# Patient Record
Sex: Male | Born: 1965 | Race: White | Hispanic: No | Marital: Married | Smoking: Former smoker
Health system: Southern US, Community
[De-identification: ages and names within clinical notes are randomized; demographics above are authoritative.]

## PROBLEM LIST (undated history)

## (undated) DIAGNOSIS — S2249XA Multiple fractures of ribs, unspecified side, initial encounter for closed fracture: Secondary | ICD-10-CM

## (undated) HISTORY — PX: NO PAST SURGERIES: SHX2092

---

## 2016-05-08 ENCOUNTER — Emergency Department (HOSPITAL_COMMUNITY): Payer: Worker's Compensation

## 2016-05-08 ENCOUNTER — Encounter (HOSPITAL_COMMUNITY): Payer: Self-pay | Admitting: Emergency Medicine

## 2016-05-08 ENCOUNTER — Inpatient Hospital Stay (HOSPITAL_COMMUNITY)
Admission: EM | Admit: 2016-05-08 | Discharge: 2016-05-11 | DRG: 185 | Disposition: A | Discharge status: Home / Self Care | Payer: Worker's Compensation | Attending: General Surgery | Admitting: General Surgery

## 2016-05-08 DIAGNOSIS — S298XXA Other specified injuries of thorax, initial encounter: Secondary | ICD-10-CM | POA: Diagnosis present

## 2016-05-08 DIAGNOSIS — W208XXA Other cause of strike by thrown, projected or falling object, initial encounter: Secondary | ICD-10-CM | POA: Diagnosis present

## 2016-05-08 DIAGNOSIS — S2242XA Multiple fractures of ribs, left side, initial encounter for closed fracture: Principal | ICD-10-CM | POA: Diagnosis present

## 2016-05-08 DIAGNOSIS — Z23 Encounter for immunization: Secondary | ICD-10-CM

## 2016-05-08 DIAGNOSIS — R079 Chest pain, unspecified: Secondary | ICD-10-CM | POA: Diagnosis not present

## 2016-05-08 DIAGNOSIS — R2681 Unsteadiness on feet: Secondary | ICD-10-CM

## 2016-05-08 DIAGNOSIS — F17290 Nicotine dependence, other tobacco product, uncomplicated: Secondary | ICD-10-CM | POA: Diagnosis present

## 2016-05-08 DIAGNOSIS — S2249XA Multiple fractures of ribs, unspecified side, initial encounter for closed fracture: Secondary | ICD-10-CM | POA: Diagnosis present

## 2016-05-08 HISTORY — DX: Multiple fractures of ribs, unspecified side, initial encounter for closed fracture: S22.49XA

## 2016-05-08 MED ORDER — SODIUM CHLORIDE 0.9 % IV BOLUS (SEPSIS)
1000.0000 mL | Freq: Once | INTRAVENOUS | Status: AC
  Administered 2016-05-09: 1000 mL via INTRAVENOUS

## 2016-05-08 MED ORDER — HYDROMORPHONE HCL 1 MG/ML IJ SOLN
1.0000 mg | Freq: Once | INTRAMUSCULAR | Status: AC
  Administered 2016-05-09: 1 mg via INTRAVENOUS
  Filled 2016-05-08: qty 1

## 2016-05-08 MED ORDER — TETANUS-DIPHTH-ACELL PERTUSSIS 5-2.5-18.5 LF-MCG/0.5 IM SUSP
0.5000 mL | Freq: Once | INTRAMUSCULAR | Status: AC
  Administered 2016-05-09: 0.5 mL via INTRAMUSCULAR
  Filled 2016-05-08: qty 0.5

## 2016-05-08 MED ORDER — FENTANYL CITRATE (PF) 100 MCG/2ML IJ SOLN: 50.0000 ug | INTRAMUSCULAR | Status: DC | PRN

## 2016-05-08 NOTE — ED Triage Notes (Signed)
Pt BIB EMS from work. Pt works for [employer] and was working underneath a truck when it began to [employer] off its jacks. Pt attempted to roll out from underneath but the moving vehicle caught his L side in passing. Pt c/o pain to L ribs, both to touch & movement. Denies pain/injury to head, neck, or spine. Denies SOB and no flail chest noted by EMS. Pt given 50mcg of Fentanyl by EMS.

## 2016-05-08 NOTE — ED Provider Notes (Signed)
[facility]-EMERGENCY DEPT Provider Note   CSN: [id number] Arrival date & time: 05/08/16  2254  By signing my name below, I, Austin Horne, attest that this documentation has been prepared under the direction and in the presence of [name], MD. Electronically Signed: Rosario Horne, ED Scribe. 05/08/16. 11:58 PM.  History   Chief Complaint Chief Complaint  Patient presents with  . Chest Pain   The history is provided by the patient, a friend and the EMS personnel. No language interpreter was used.   HPI Comments: Austin Horne is a 50 y.o. male BIB EMS, with no pertinent PMHx, who presents to the Emergency Department complaining of sudden onset, gradually worsening left-sided ribcage pain w/ associated wound to the area onset just PTA. He notes that his pain is now radiating into the upper left portion of his chest and he describes it as sharp. Pt reports that he was at work when a portion of truck that he was working underneath of fell and rolled onto the left portion of his chest, sustaining his current injuries. No other trauma or injuries. His pain to the area is exacerbated to palpation of the area and with generalized movements. EMS placed a cervical collar and administered of Fentanyl en route with minimal relief of his current pain. Denies neck pain, SOB, or any other associated symptoms. Tetanus is not UTD.   History reviewed. No pertinent past medical history.  There are no active problems to display for this patient.  History reviewed. No pertinent surgical history.  Home Medications    Prior to Admission medications   Medication Sig Start Date End Date Taking? Authorizing Provider  Omega-3 Fatty Acids (FISH OIL PO) Take 1 capsule by mouth daily.   Yes Historical Provider, MD   Family History No family history on file.  Social History Social History  Substance Use Topics  . Smoking status: Never Smoker  . Smokeless tobacco: Current User    Types:  Snuff  . Alcohol use 14.4 oz/week    24 Cans of beer per week   Allergies   Review of patient's allergies indicates no known allergies.  Review of Systems Review of Systems A complete 10 system review of systems was obtained and all systems are negative except as noted in the HPI and PMH.   Physical Exam Updated Vital Signs BP 136/92   Pulse 62   Temp 98.6 F (37 C) (Oral)   Resp 13   Ht 5\' 10"  (1.778 m)   Wt 220 lb (99.8 kg)   SpO2 98%   BMI 31.57 kg/m   Physical Exam  Constitutional: He is oriented to person, place, and time. Vital signs are normal. He appears well-developed and well-nourished.  Non-toxic appearance. He does not appear ill. No distress. Cervical collar in place.  HENT:  Head: Normocephalic and atraumatic.  Nose: Nose normal.  Mouth/Throat: Oropharynx is clear and moist. No oropharyngeal exudate.  Eyes: Conjunctivae and EOM are normal. Pupils are equal, round, and reactive to light. No scleral icterus.  Neck: Normal range of motion. Neck supple. No tracheal deviation, no edema, no erythema and normal range of motion present. No thyroid mass and no thyromegaly present.  Cardiovascular: Normal rate, regular rhythm, S1 normal, S2 normal, normal heart sounds, intact distal pulses and normal pulses.  Exam reveals no gallop and no friction rub.   No murmur heard. Pulmonary/Chest: Effort normal and breath sounds normal. No respiratory distress. He has no wheezes. He has no rhonchi.  He has no rales.  Abdominal: Soft. Normal appearance and bowel sounds are normal. He exhibits no distension, no ascites and no mass. There is no hepatosplenomegaly. There is no tenderness. There is no rebound, no guarding and no CVA tenderness.  Musculoskeletal: He exhibits tenderness. He exhibits no edema.  TTP over the left lateral ribs. 0.5cm abrasion over the same area.   Lymphadenopathy:    He has no cervical adenopathy.  Neurological: He is alert and oriented to person, place, and  time. He has normal strength. No cranial nerve deficit or sensory deficit.  Skin: Skin is warm, dry and intact. No petechiae and no rash noted. He is not diaphoretic. No erythema. No pallor.  Nursing note and vitals reviewed.  ED Treatments / Results  DIAGNOSTIC STUDIES: Oxygen Saturation is 97% on RA, normal by my interpretation.   COORDINATION OF CARE: 11:48 PM-Discussed next steps with pt. Pt verbalized understanding and is agreeable with the plan.   Labs (all labs ordered are listed, but only abnormal results are displayed) Labs Reviewed  CBC WITH DIFFERENTIAL/PLATELET - Abnormal; Notable for the following:       Result Value   WBC 14.7 (*)    Neutro Abs 12.2 (*)    All other components within normal limits  I-STAT CHEM 8, ED - Abnormal; Notable for the following:    Glucose, Bld 118 (*)    All other components within normal limits    EKG  EKG Interpretation None      Radiology Ct Chest W Contrast  Result Date: 05/09/2016 CLINICAL DATA:  U[employer] truck fell on left chest.  Left chest pain. EXAM: CT CHEST WITH CONTRAST TECHNIQUE: Multidetector CT imaging of the chest was performed during intravenous contrast administration. CONTRAST:  80mL ISOVUE-300 IOPAMIDOL (ISOVUE-300) INJECTION 61% COMPARISON:  Radiograph earlier this day. FINDINGS: Cardiovascular: No significant vascular findings. Normal heart size. No pericardial effusion. Mediastinum/Nodes: No enlarged mediastinal, hilar, or axillary lymph nodes. Thyroid gland, trachea, and esophagus demonstrate no significant findings. Lungs/Pleura: No pneumo or hemothorax. There is left pleural thickening adjacent to multiple rib fractures. No pulmonary contusion. Minimal atelectasis adjacent pleural thickening. Minimal dependent atelectasis also seen in the right lung. No pulmonary contusion. Upper Abdomen: No acute abnormality. Majority of the spleen is visualized and normal, no perisplenic fluid. Musculoskeletal: Note, there are 11  pairs of ribs in the included field of view. Multiple left-sided rib fractures. Fractures of the second through eighth ribs posteriorly are nondisplaced. Fracture of posterior left tenth rib is minimally displaced. No sternal fracture. No fracture or subluxation of the thoracic spine. Well corticated density posterior to spinous process of T1 appears chronic. IMPRESSION: Multiple posterior left rib fractures, nondisplaced involving ribs 2 through 8 and minimally displaced involving rib 10. Minimal adjacent pleural thickening but no hemothorax or pneumothorax. Electronically Signed   By: Rubye OaksMelanie  Ehinger M.D.   On: 05/09/2016 02:40   Dg Chest Port 1 View  Result Date: 05/09/2016 CLINICAL DATA:  50 y/o M; truck fell onto left-sided chest with chest pain and shortness of breath. EXAM: PORTABLE CHEST 1 VIEW COMPARISON:  None. FINDINGS: Normal cardiomediastinal silhouette. Low lung volumes accentuate pulmonary markings. Clear lungs. No pneumothorax or pleural effusion. Left posterior third and possibly fourth rib fractures identified. IMPRESSION: No acute cardiopulmonary process. No pneumothorax. Left posterior third and possibly fourth rib fractures, age indeterminate. Consider additional rib radiographs for better characterization. Electronically Signed   By: Mitzi HansenLance  Furusawa-Stratton M.D.   On: 05/09/2016 00:39    Procedures  Procedures   Medications Ordered in ED Medications  fentaNYL (SUBLIMAZE) injection 50 mcg (not administered)  HYDROmorphone (DILAUDID) injection 1 mg (1 mg Intravenous Given 05/09/16 0030)  sodium chloride 0.9 % bolus 1,000 mL (0 mLs Intravenous Stopped 05/09/16 0155)  Tdap (BOOSTRIX) injection 0.5 mL (0.5 mLs Intramuscular Given 05/09/16 0105)  iopamidol (ISOVUE-300) 61 % injection (80 mLs  Contrast Given 05/09/16 0038)  HYDROmorphone (DILAUDID) injection 1 mg (1 mg Intravenous Given 05/09/16 0155)  HYDROmorphone (DILAUDID) injection 0.5 mg (0.5 mg Intravenous Given 05/09/16  0258)   Initial Impression / Assessment and Plan / ED Course  I have reviewed the triage vital signs and the nursing notes.  Pertinent labs & imaging results that were available during my care of the patient were reviewed by me and considered in my medical decision making (see chart for details).  Clinical Course   Patient presents to the ED after a truck fell in his chest.  He has L rib pain, likely fractured based on physical exam.  Will obtain CT chest for further evaluation.  Tetanus updated.  Pain medication given.  3:11 AM CT reveals multiple rib fractures and his pain is difficult to control.  He warrants admission for obs.  I spoke with Dr. Sheliah Hatch who will admit for further care.  Final Clinical Impressions(s) / ED Diagnoses   Final diagnoses:  Closed fracture of multiple ribs of left side, initial encounter   New Prescriptions New Prescriptions   No medications on file   I personally performed the services described in this documentation, which was scribed in my presence. The recorded information has been reviewed and is accurate.      [name], MD 05/09/16 [phone]

## 2016-05-09 ENCOUNTER — Emergency Department (HOSPITAL_COMMUNITY): Payer: Worker's Compensation

## 2016-05-09 ENCOUNTER — Encounter (HOSPITAL_COMMUNITY): Payer: Self-pay | Admitting: Radiology

## 2016-05-09 DIAGNOSIS — S2249XA Multiple fractures of ribs, unspecified side, initial encounter for closed fracture: Secondary | ICD-10-CM | POA: Diagnosis present

## 2016-05-09 LAB — BASIC METABOLIC PANEL
Anion gap: 9 (ref 5–15)
BUN: 7 mg/dL (ref 6–20)
CHLORIDE: 105 mmol/L (ref 101–111)
CO2: 25 mmol/L (ref 22–32)
Calcium: 8.5 mg/dL — ABNORMAL LOW (ref 8.9–10.3)
Creatinine, Ser: 1 mg/dL (ref 0.61–1.24)
GFR calc non Af Amer: >60 mL/min (ref >60)
Glucose, Bld: 102 mg/dL — ABNORMAL HIGH (ref 65–99)
POTASSIUM: 3.3 mmol/L — AB (ref 3.5–5.1)
SODIUM: 139 mmol/L (ref 135–145)

## 2016-05-09 LAB — CBC WITH DIFFERENTIAL/PLATELET
BASOS ABS: 0 10*3/uL (ref 0.0–0.1)
BASOS PCT: 0 %
EOS ABS: 0.1 10*3/uL (ref 0.0–0.7)
Eosinophils Relative: 1 %
HEMATOCRIT: 44.3 % (ref 39.0–52.0)
HEMOGLOBIN: 15.3 g/dL (ref 13.0–17.0)
Lymphocytes Relative: 11 %
Lymphs Abs: 1.6 10*3/uL (ref 0.7–4.0)
MCH: 30.5 pg (ref 26.0–34.0)
MCHC: 34.5 g/dL (ref 30.0–36.0)
MCV: 88.4 fL (ref 78.0–100.0)
Monocytes Absolute: 0.8 10*3/uL (ref 0.1–1.0)
Monocytes Relative: 5 %
NEUTROS ABS: 12.2 10*3/uL — AB (ref 1.7–7.7)
NEUTROS PCT: 83 %
Platelets: 251 10*3/uL (ref 150–400)
RBC: 5.01 MIL/uL (ref 4.22–5.81)
RDW: 12.4 % (ref 11.5–15.5)
WBC: 14.7 10*3/uL — AB (ref 4.0–10.5)

## 2016-05-09 LAB — CBC
HEMATOCRIT: 43.2 % (ref 39.0–52.0)
HEMOGLOBIN: 14.7 g/dL (ref 13.0–17.0)
MCH: 30.2 pg (ref 26.0–34.0)
MCHC: 34 g/dL (ref 30.0–36.0)
MCV: 88.7 fL (ref 78.0–100.0)
Platelets: 237 10*3/uL (ref 150–400)
RBC: 4.87 MIL/uL (ref 4.22–5.81)
RDW: 12.5 % (ref 11.5–15.5)
WBC: 10.8 10*3/uL — ABNORMAL HIGH (ref 4.0–10.5)

## 2016-05-09 LAB — I-STAT CHEM 8, ED
BUN: 10 mg/dL (ref 6–20)
CREATININE: 0.9 mg/dL (ref 0.61–1.24)
Calcium, Ion: 1.16 mmol/L (ref 1.15–1.40)
Chloride: 102 mmol/L (ref 101–111)
Glucose, Bld: 118 mg/dL — ABNORMAL HIGH (ref 65–99)
HEMATOCRIT: 44 % (ref 39.0–52.0)
Hemoglobin: 15 g/dL (ref 13.0–17.0)
POTASSIUM: 3.7 mmol/L (ref 3.5–5.1)
SODIUM: 141 mmol/L (ref 135–145)
TCO2: 24 mmol/L (ref 0–100)

## 2016-05-09 MED ORDER — OXYCODONE HCL 5 MG PO TABS
5.0000 mg | ORAL_TABLET | ORAL | Status: DC | PRN
  Administered 2016-05-09 – 2016-05-10 (×4): 10 mg via ORAL
  Filled 2016-05-09 (×4): qty 2

## 2016-05-09 MED ORDER — IBUPROFEN 600 MG PO TABS
600.0000 mg | ORAL_TABLET | Freq: Four times a day (QID) | ORAL | Status: DC | PRN
Start: 2016-05-09 — End: 2016-05-10
  Administered 2016-05-09: 600 mg via ORAL
  Filled 2016-05-09: qty 1

## 2016-05-09 MED ORDER — METHOCARBAMOL 500 MG PO TABS
500.0000 mg | ORAL_TABLET | Freq: Three times a day (TID) | ORAL | Status: DC | PRN
  Administered 2016-05-09 – 2016-05-10 (×3): 500 mg via ORAL
  Filled 2016-05-09 (×3): qty 1

## 2016-05-09 MED ORDER — HYDROMORPHONE HCL 1 MG/ML IJ SOLN
0.5000 mg | Freq: Once | INTRAMUSCULAR | Status: AC
  Administered 2016-05-09: 0.5 mg via INTRAVENOUS
  Filled 2016-05-09: qty 1

## 2016-05-09 MED ORDER — OXYCODONE HCL 5 MG PO TABS: 5.0000 mg | ORAL_TABLET | ORAL | Status: DC | PRN

## 2016-05-09 MED ORDER — ONDANSETRON HCL 4 MG PO TABS: 4.0000 mg | ORAL_TABLET | Freq: Four times a day (QID) | ORAL | Status: DC | PRN

## 2016-05-09 MED ORDER — ACETAMINOPHEN 325 MG PO TABS: 650.0000 mg | ORAL_TABLET | ORAL | Status: DC | PRN

## 2016-05-09 MED ORDER — MORPHINE SULFATE (PF) 2 MG/ML IV SOLN
2.0000 mg | INTRAVENOUS | Status: DC | PRN
  Administered 2016-05-09 (×2): 2 mg via INTRAVENOUS
  Administered 2016-05-10: 4 mg via INTRAVENOUS
  Administered 2016-05-10 (×2): 2 mg via INTRAVENOUS
  Filled 2016-05-09 (×3): qty 1
  Filled 2016-05-09: qty 2
  Filled 2016-05-09: qty 1

## 2016-05-09 MED ORDER — KETOROLAC TROMETHAMINE 30 MG/ML IJ SOLN
30.0000 mg | Freq: Once | INTRAMUSCULAR | Status: AC
  Administered 2016-05-09: 30 mg via INTRAVENOUS
  Filled 2016-05-09: qty 1

## 2016-05-09 MED ORDER — IOPAMIDOL (ISOVUE-300) INJECTION 61%
INTRAVENOUS | Status: AC
  Administered 2016-05-09: 80 mL
  Filled 2016-05-09: qty 100

## 2016-05-09 MED ORDER — HYDROMORPHONE HCL 1 MG/ML IJ SOLN
1.0000 mg | Freq: Once | INTRAMUSCULAR | Status: AC
  Administered 2016-05-09: 1 mg via INTRAVENOUS
  Filled 2016-05-09: qty 1

## 2016-05-09 MED ORDER — ONDANSETRON HCL 4 MG/2ML IJ SOLN: 4.0000 mg | Freq: Four times a day (QID) | INTRAMUSCULAR | Status: DC | PRN

## 2016-05-09 NOTE — ED Notes (Signed)
Patient transported to CT 

## 2016-05-09 NOTE — Progress Notes (Signed)
OT Cancellation Note  Patient Details Name: Austin SlatesKevin Horne MRN: [id number] DOB: 09-Aug-1965   Cancelled Treatment:    Reason Eval/Treat Not Completed: Other (comment). Passed PT in hallway who had just finished evaluating pt and reported that he was trying to clear his throat while they were ambulating and this set off severe pain on left side, they had to push him back to bed in chair and RN gave pt pain meds. No a good time currently for me to try and evaluate him. Will re-attempt eval at a later time.  Evette GeorgesLeonard, Clarke Amburn Eva [phone] 05/09/2016, 12:14 PM

## 2016-05-09 NOTE — Progress Notes (Signed)
Patient just to room this morning.  Will order PT/OT.  Try to get the patient home by tomorrow.  Marta LamasJames O. Gae BonWyatt, III, MD, FACS [phone] Trauma Surgeon

## 2016-05-09 NOTE — ED Notes (Signed)
Patient transported to X-ray 

## 2016-05-09 NOTE — Care Management Note (Signed)
Case Management Note  Patient Details  Name: Austin SlatesKevin Horne MRN: [id number] Date of Birth: May 03, 1966  Subjective/Objective:  Pt admitted on 05/08/16 after a [employer] truck he was working on partially landed on his Lt chest.  He sustained multiple rib fractures.   PTA, pt independent, lives with spouse, [name].                   Action/Plan: Will follow for discharge planning as pt progresses.  PT recommending no OP follow up.    Expected Discharge Date:                  Expected Discharge Plan:  Home/Self Care  In-House Referral:     Discharge planning Services  CM Consult  Post Acute Care Choice:    Choice offered to:     DME Arranged:    DME Agency:     HH Arranged:    HH Agency:     Status of Service:  In process, will continue to follow  If discussed at Long Length of Stay Meetings, dates discussed:    Additional Comments:  Quintella BatonJulie W. Amahri Dengel, RN, BSN  Trauma/Neuro ICU Case Manager [phone]

## 2016-05-09 NOTE — Evaluation (Signed)
Physical Therapy Evaluation Patient Details Name: Austin Horne MRN: [id number] DOB: 24-Jul-1966 Today's Date: 05/09/2016   History of Present Illness  50 yo male working under [employer] truck when jack slipped. The truck partially landed on his left chest while rolling. He was able to slide himself from under the truck in a matter of seconds. No loss of consciousness or head trauma. Xray revealed L rib fxs 2-8 and 10. No fx noted L knee.  Clinical Impression  On eval, pt required min assist for bed mobility and transfers. Min guard assist provided for ambulation 150 feet. When pt began experiencing muscle spasms L flank, he needed min assist to maintain stance due to 10/10 pain. Once pain is managed, pt will be able to manage mobility at home with supervision from spouse. No DME recommended from PT or follow up services. PT will continue to follow acutely.    Follow Up Recommendations No PT follow up;Supervision for mobility/OOB    Equipment Recommendations  None recommended by PT    Recommendations for Other Services       Precautions / Restrictions Precautions Precautions: Fall Restrictions Weight Bearing Restrictions: No      Mobility  Bed Mobility Overal bed mobility: Needs Assistance Bed Mobility: Supine to Sit;Sit to Supine     Supine to sit: Min assist Sit to supine: Min assist   General bed mobility comments: assist to elevate trunk during sup to sit. Assist with BLE during sit to sup.  Transfers Overall transfer level: Needs assistance Equipment used: None Transfers: Sit to/from [employer] CorporationStand;Stand Pivot Transfers Sit to Stand: Min assist Stand pivot transfers: Min assist       General transfer comment: increased time to attain full upright stance and stabilize initial balance  Ambulation/Gait Ambulation/Gait assistance: Min guard Ambulation Distance (Feet): 150 Feet Assistive device: None Gait Pattern/deviations: Step-through pattern;Decreased stride length Gait  velocity: decreased Gait velocity interpretation: Below normal speed for age/gender General Gait Details: Pt with c/o mild stiffness L knee during ambulation but no pain. After completing 150 feet ambulation, pt reported feeling dry mouth with initiated the need to cough. This brought on ms spasms in rib cage resulting in 10/10 pain. Pt's knees buckling from the pain requiring min assist to maintain stance. NT retrieved chair and pt rolled back to room. After seated rest, pt was able to SPT back to bed with min assist.  Stairs            Wheelchair Mobility    Modified Rankin (Stroke Patients Only)       Balance Overall balance assessment: No apparent balance deficits (not formally assessed)                                           Pertinent Vitals/Pain Pain Assessment: 0-10 Pain Score: 10-Worst pain ever Pain Location: L flank during ms spasms Pain Descriptors / Indicators: Spasm Pain Intervention(s): Repositioned;Patient requesting pain meds-RN notified;Monitored during session;Limited activity within patient's tolerance    Home Living Family/patient expects to be discharged to:: Private residence Living Arrangements: Spouse/significant other Available Help at Discharge: Family;Available 24 hours/day Type of Home: Mobile home Home Access: Stairs to enter Entrance Stairs-Rails: Right;Left;Can reach both Entrance Stairs-Number of Steps: 5 Home Layout: One level Home Equipment: None      Prior Function Level of Independence: Independent         Comments: Pt injured at  work.     Higher education careers adviser        Extremity/Trunk Assessment                         Communication   Communication: No difficulties  Cognition Arousal/Alertness: Awake/alert Behavior During Therapy: WFL for tasks assessed/performed Overall Cognitive Status: Within Functional Limits for tasks assessed                      General Comments       Exercises     Assessment/Plan    PT Assessment Patient needs continued PT services  PT Problem List Decreased activity tolerance;Decreased mobility;Pain;Decreased knowledge of precautions          PT Treatment Interventions DME instruction;Gait training;Stair training;Functional mobility training;Balance training;Therapeutic exercise;Therapeutic activities;Patient/family education    PT Goals (Current goals can be found in the Care Plan section)  Acute Rehab PT Goals Patient Stated Goal: decrease pain PT Goal Formulation: With patient Time For Goal Achievement: 05/16/16 Potential to Achieve Goals: Good    Frequency Min 6X/week   Barriers to discharge        Co-evaluation               End of Session Equipment Utilized During Treatment: Gait belt Activity Tolerance: Patient limited by pain Patient left: in bed;with call bell/phone within reach Nurse Communication: Mobility status;Patient requests pain meds    Functional Assessment Tool Used: clinical judgement Functional Limitation: Mobility: Walking and moving around Mobility: Walking and Moving Around Current Status [phone]): At least 1 percent but less than 20 percent impaired, limited or restricted Mobility: Walking and Moving Around Goal Status [phone]): At least 1 percent but less than 20 percent impaired, limited or restricted    Time: 1143-1210 PT Time Calculation (min) (ACUTE ONLY): 27 min   Charges:   PT Evaluation $PT Eval Moderate Complexity: 1 Procedure PT Treatments $Gait Training: 8-22 mins   PT G Codes:   PT G-Codes **NOT FOR INPATIENT CLASS** Functional Assessment Tool Used: clinical judgement Functional Limitation: Mobility: Walking and moving around Mobility: Walking and Moving Around Current Status (U9811): At least 1 percent but less than 20 percent impaired, limited or restricted Mobility: Walking and Moving Around Goal Status [phone]): At least 1 percent but less than 20 percent  impaired, limited or restricted    Ilda Foil 05/09/2016, 12:28 PM

## 2016-05-09 NOTE — H&P (Signed)
**Note Austin-Identified via Obfuscation** History   Austin SlatesKevin Horne is an 50 y.o. male.   Chief Complaint:  Chief Complaint  Patient presents with  . Chest Pain    50 yo male working under [employer] truck when jack slipped. The truck partially landed on his left chest while rolling. He was able to slide himself from under the truck in a matter of seconds. No loss of consciousness or head trauma. He complains of left chest pain currently and some tightness in his left knee.   Chest Pain  Associated symptoms: no abdominal pain, no cough, no dizziness, no fever, no headache, no nausea, no palpitations and no vomiting     History reviewed. No pertinent past medical history.  History reviewed. No pertinent surgical history.  No family history on file. Social History:  reports that he has never smoked. His smokeless tobacco use includes Snuff. He reports that he drinks about 14.4 oz of alcohol per week . He reports that he does not use drugs.  Allergies  No Known Allergies  Home Medications   (Not in a hospital admission)  Trauma Course   Results for orders placed or performed during the hospital encounter of 05/08/16 (from the past 48 hour(s))  CBC with Differential/Platelet     Status: Abnormal   Collection Time: 05/09/16  1:05 AM  Result Value Ref Range   WBC 14.7 (H) 4.0 - 10.5 K/uL   RBC 5.01 4.22 - 5.81 MIL/uL   Hemoglobin 15.3 13.0 - 17.0 g/dL   HCT 11.944.3 14.739.0 - 82.952.0 %   MCV 88.4 78.0 - 100.0 fL   MCH 30.5 26.0 - 34.0 pg   MCHC 34.5 30.0 - 36.0 g/dL   RDW 56.212.4 13.011.5 - 86.515.5 %   Platelets 251 150 - 400 K/uL   Neutrophils Relative % 83 %   Neutro Abs 12.2 (H) 1.7 - 7.7 K/uL   Lymphocytes Relative 11 %   Lymphs Abs 1.6 0.7 - 4.0 K/uL   Monocytes Relative 5 %   Monocytes Absolute 0.8 0.1 - 1.0 K/uL   Eosinophils Relative 1 %   Eosinophils Absolute 0.1 0.0 - 0.7 K/uL   Basophils Relative 0 %   Basophils Absolute 0.0 0.0 - 0.1 K/uL  I-stat chem 8, ed     Status: Abnormal   Collection Time: 05/09/16  1:11 AM  Result  Value Ref Range   Sodium 141 135 - 145 mmol/L   Potassium 3.7 3.5 - 5.1 mmol/L   Chloride 102 101 - 111 mmol/L   BUN 10 6 - 20 mg/dL   Creatinine, Ser 7.840.90 0.61 - 1.24 mg/dL   Glucose, Bld 696118 (H) 65 - 99 mg/dL   Calcium, Ion 2.951.16 2.841.15 - 1.40 mmol/L   TCO2 24 0 - 100 mmol/L   Hemoglobin 15.0 13.0 - 17.0 g/dL   HCT 13.244.0 44.039.0 - 10.252.0 %   Ct Chest W Contrast  Result Date: 05/09/2016 CLINICAL DATA:  UPS truck fell on left chest.  Left chest pain. EXAM: CT CHEST WITH CONTRAST TECHNIQUE: Multidetector CT imaging of the chest was performed during intravenous contrast administration. CONTRAST:  80mL ISOVUE-300 IOPAMIDOL (ISOVUE-300) INJECTION 61% COMPARISON:  Radiograph earlier this day. FINDINGS: Cardiovascular: No significant vascular findings. Normal heart size. No pericardial effusion. Mediastinum/Nodes: No enlarged mediastinal, hilar, or axillary lymph nodes. Thyroid gland, trachea, and esophagus demonstrate no significant findings. Lungs/Pleura: No pneumo or hemothorax. There is left pleural thickening adjacent to multiple rib fractures. No pulmonary contusion. Minimal atelectasis adjacent pleural thickening. Minimal dependent atelectasis  also seen in the right lung. No pulmonary contusion. Upper Abdomen: No acute abnormality. Majority of the spleen is visualized and normal, no perisplenic fluid. Musculoskeletal: Note, there are 11 pairs of ribs in the included field of view. Multiple left-sided rib fractures. Fractures of the second through eighth ribs posteriorly are nondisplaced. Fracture of posterior left tenth rib is minimally displaced. No sternal fracture. No fracture or subluxation of the thoracic spine. Well corticated density posterior to spinous process of T1 appears chronic. IMPRESSION: Multiple posterior left rib fractures, nondisplaced involving ribs 2 through 8 and minimally displaced involving rib 10. Minimal adjacent pleural thickening but no hemothorax or pneumothorax. Electronically  Signed   By: Rubye Oaks M.D.   On: 05/09/2016 02:40   Dg Chest Port 1 View  Result Date: 05/09/2016 CLINICAL DATA:  50 y/o M; truck fell onto left-sided chest with chest pain and shortness of breath. EXAM: PORTABLE CHEST 1 VIEW COMPARISON:  None. FINDINGS: Normal cardiomediastinal silhouette. Low lung volumes accentuate pulmonary markings. Clear lungs. No pneumothorax or pleural effusion. Left posterior third and possibly fourth rib fractures identified. IMPRESSION: No acute cardiopulmonary process. No pneumothorax. Left posterior third and possibly fourth rib fractures, age indeterminate. Consider additional rib radiographs for better characterization. Electronically Signed   By: Mitzi Hansen M.D.   On: 05/09/2016 00:39   Dg Knee Complete 4 Views Left  Result Date: 05/09/2016 CLINICAL DATA:  Left knee pain.  Car fell on top of patient. EXAM: LEFT KNEE - COMPLETE 4+ VIEW COMPARISON:  None. FINDINGS: No acute fracture or subluxation. Mild tricompartmental osteoarthritis with peripheral spurring. No joint effusion. Anterior soft tissue edema. IMPRESSION: 1. Anterior soft tissue edema. No fracture or dislocation. No joint effusion. 2. Mild tricompartmental osteoarthritis. Electronically Signed   By: Rubye Oaks M.D.   On: 05/09/2016 03:55    Review of Systems  Constitutional: Negative for chills and fever.  HENT: Negative for hearing loss.   Eyes: Negative for blurred vision and double vision.  Respiratory: Negative for cough and hemoptysis.   Cardiovascular: Positive for chest pain. Negative for palpitations.  Gastrointestinal: Negative for abdominal pain, nausea and vomiting.  Genitourinary: Negative for dysuria and urgency.  Musculoskeletal: Negative for myalgias and neck pain.  Skin: Negative for itching and rash.  Neurological: Negative for dizziness, tingling and headaches.  Endo/Heme/Allergies: Does not bruise/bleed easily.  Psychiatric/Behavioral: Negative for  depression and suicidal ideas.    Blood pressure 114/67, pulse 64, temperature 98.6 F (37 C), temperature source Oral, resp. rate 13, height 5\' 10"  (1.778 m), weight 99.8 kg (220 lb), SpO2 97 %. Physical Exam  Nursing note and vitals reviewed. Constitutional: He is oriented to person, place, and time. He appears well-developed and well-nourished.  HENT:  Head: Normocephalic and atraumatic.  Eyes: Conjunctivae and EOM are normal. No scleral icterus.  Neck: Normal range of motion. Neck supple.  Cardiovascular: Normal rate and regular rhythm.   Respiratory: Effort normal and breath sounds normal. He has no wheezes. He has no rales. He exhibits tenderness.  Small 3cm abrasion left posterior chest  GI: Soft. He exhibits no distension. There is no tenderness. There is no rebound.  Musculoskeletal: Normal range of motion. He exhibits no edema.  Neurological: He is alert and oriented to person, place, and time.  Skin: Skin is warm and dry.  Psychiatric: He has a normal mood and affect. His behavior is normal.     Assessment/Plan 50 yo male with left ribs 2-8 and 10 after traumatic incident while  working under a [employer] truck. No signs of head injury, neck cleared clinically. He has required a moderate amount of pain medication in the ER and has a leukocytosis. We discussed issues with rib fractures and goals to get his pain under control and attempt to prevent any lung infection or secondary issues. XR of left knee reviewed, no fracture, will ambulate patient in am. -observation for pain control after multiple rib fractures -IS, pulm toilet -ambulate -full liquids and advance  Austin Horne 05/09/2016, 4:08 AM   Procedures

## 2016-05-10 DIAGNOSIS — F17290 Nicotine dependence, other tobacco product, uncomplicated: Secondary | ICD-10-CM | POA: Diagnosis present

## 2016-05-10 DIAGNOSIS — S2242XA Multiple fractures of ribs, left side, initial encounter for closed fracture: Secondary | ICD-10-CM | POA: Diagnosis present

## 2016-05-10 DIAGNOSIS — R079 Chest pain, unspecified: Secondary | ICD-10-CM | POA: Diagnosis present

## 2016-05-10 DIAGNOSIS — S298XXA Other specified injuries of thorax, initial encounter: Secondary | ICD-10-CM | POA: Diagnosis present

## 2016-05-10 DIAGNOSIS — Z23 Encounter for immunization: Secondary | ICD-10-CM | POA: Diagnosis not present

## 2016-05-10 DIAGNOSIS — W208XXA Other cause of strike by thrown, projected or falling object, initial encounter: Secondary | ICD-10-CM | POA: Diagnosis present

## 2016-05-10 MED ORDER — DOCUSATE SODIUM 100 MG PO CAPS
100.0000 mg | ORAL_CAPSULE | Freq: Two times a day (BID) | ORAL | Status: DC
  Administered 2016-05-10 – 2016-05-11 (×3): 100 mg via ORAL
  Filled 2016-05-10 (×3): qty 1

## 2016-05-10 MED ORDER — POLYETHYLENE GLYCOL 3350 17 G PO PACK
17.0000 g | PACK | Freq: Every day | ORAL | Status: DC
  Administered 2016-05-10 – 2016-05-11 (×2): 17 g via ORAL
  Filled 2016-05-10 (×2): qty 1

## 2016-05-10 MED ORDER — TRAMADOL HCL 50 MG PO TABS
100.0000 mg | ORAL_TABLET | Freq: Four times a day (QID) | ORAL | Status: DC
  Administered 2016-05-10 – 2016-05-11 (×4): 100 mg via ORAL
  Filled 2016-05-10 (×5): qty 2

## 2016-05-10 MED ORDER — MORPHINE SULFATE (PF) 2 MG/ML IV SOLN
2.0000 mg | INTRAVENOUS | Status: DC | PRN
  Administered 2016-05-10 (×2): 2 mg via INTRAVENOUS
  Filled 2016-05-10 (×2): qty 1

## 2016-05-10 MED ORDER — OXYCODONE HCL 5 MG PO TABS
10.0000 mg | ORAL_TABLET | ORAL | Status: DC | PRN
  Administered 2016-05-10 – 2016-05-11 (×5): 20 mg via ORAL
  Filled 2016-05-10 (×5): qty 4

## 2016-05-10 MED ORDER — NAPROXEN 250 MG PO TABS
500.0000 mg | ORAL_TABLET | Freq: Two times a day (BID) | ORAL | Status: DC
  Administered 2016-05-10 – 2016-05-11 (×2): 500 mg via ORAL
  Filled 2016-05-10 (×2): qty 2

## 2016-05-10 MED ORDER — ZOLPIDEM TARTRATE 5 MG PO TABS
10.0000 mg | ORAL_TABLET | Freq: Every evening | ORAL | Status: DC | PRN
  Administered 2016-05-10: 10 mg via ORAL
  Filled 2016-05-10: qty 2

## 2016-05-10 MED ORDER — ENOXAPARIN SODIUM 30 MG/0.3ML ~~LOC~~ SOLN
30.0000 mg | Freq: Two times a day (BID) | SUBCUTANEOUS | Status: DC
  Administered 2016-05-10 – 2016-05-11 (×3): 30 mg via SUBCUTANEOUS
  Filled 2016-05-10 (×3): qty 0.3

## 2016-05-10 MED ORDER — METHOCARBAMOL 750 MG PO TABS
1500.0000 mg | ORAL_TABLET | Freq: Four times a day (QID) | ORAL | Status: DC
Start: 2016-05-10 — End: 2016-05-11
  Administered 2016-05-10 – 2016-05-11 (×6): 1500 mg via ORAL
  Filled 2016-05-10 (×6): qty 2

## 2016-05-10 NOTE — Progress Notes (Signed)
Patient ID: Encarnacion SlatesKevin Mumaw, male   DOB: [birth year], 50 y.o.   MRN: [id number]   LOS: 0 days   Subjective: Horrible pain, can't move, seeming muscle spasms whenever he falls asleep.   Objective: Vital signs in last 24 hours: Temp:  [98 F (36.7 C)-98.8 F (37.1 C)] 98.2 F (36.8 C) (10/11 0620) Pulse Rate:  [57-63] 57 (10/11 0620) Resp:  [16] 16 (10/11 0620) BP: (123-143)/(61-80) 123/75 (10/11 0620) SpO2:  [96 %-98 %] 98 % (10/11 0620) Last BM Date: 05/08/16   IS: 500ml   Physical Exam General appearance: alert and no distress Resp: clear to auscultation bilaterally Cardio: regular rate and rhythm GI: normal findings: bowel sounds normal and soft, non-tender   Assessment/Plan: Blunt thoracic trauma Multiple left rib fxs -- Pulmonary toilet FEN -- Add NSAID and tramadol, increase and schedule muscle relaxer, increase OxyIR range VTE -- SCD's, Lovenox Dispo -- Need better pain control    Freeman CaldronMichael J. Tiajah Oyster, PA-C Pager: [phone] General Trauma PA Pager: [phone]  05/10/2016

## 2016-05-10 NOTE — Progress Notes (Signed)
Pt. Stated he felt SOB after getting up to the chair. O2 sats were 94%. Pt. Requested oxygen. Placed him on 1Liter of O2.

## 2016-05-10 NOTE — Progress Notes (Signed)
Physical Therapy Treatment Patient Details Name: Austin SlatesKevin Horne MRN: [id number] DOB: [birth year] Today's Date: 05/10/2016    History of Present Illness 50 yo male working under [employer] truck when jack slipped. The truck partially landed on his left chest while rolling. He was able to slide himself from under the truck in a matter of seconds. No loss of consciousness or head trauma. Xray revealed L rib fxs 2-8 and 10. No fx noted L knee.    PT Comments    Pt presents with significant decline in function secondary to increase in pain in L flank/rib cage.  Pt will require rehab in a post acute setting before returning home.  Pt is currently providing +2 assistance for safe mobility.  Informed supervising PT of patient recommendations.     Follow Up Recommendations  CIR (CIR )     Equipment Recommendations  None recommended by PT (TBD at next venue.  )    Recommendations for Other Services Rehab consult     Precautions / Restrictions Precautions Precautions: Fall Precaution Comments: premedicate due to spasms Restrictions Weight Bearing Restrictions: No    Mobility  Bed Mobility Overal bed mobility: Needs Assistance Bed Mobility: Rolling Rolling: Max assist     Sit to supine: Mod assist;+2 for physical assistance   General bed mobility comments: Pt required assist for back to bed transfer.  pillow placed on L flank to splint rib fractures.    Transfers Overall transfer level: Needs assistance Equipment used: None Transfers: Sit to/from Stand Sit to Stand: Mod assist;+2 physical assistance (from chair to stand in stedy frame.  From stedy seat plates require min assist +1.  )         General transfer comment: Pt stood from recliner to stedy frame and from stedy seat plates.  +2 assist provided from lower seat height.  Pt groaning in pain during session.  Pt required cues for hand placement and assistance to boost into standing.    Ambulation/Gait Ambulation/Gait assistance:   (unable, spasms severly limiting function at this point in recovery.  )               Stairs            Wheelchair Mobility    Modified Rankin (Stroke Patients Only)       Balance Overall balance assessment: Needs assistance   Sitting balance-Leahy Scale: Poor       Standing balance-Leahy Scale: Poor                      Cognition Arousal/Alertness: Awake/alert Behavior During Therapy: WFL for tasks assessed/performed Overall Cognitive Status: Within Functional Limits for tasks assessed                      Exercises      General Comments        Pertinent Vitals/Pain Pain Assessment: 0-10 Pain Score: 10-Worst pain ever Pain Location: L flank with severe spasms Pain Descriptors / Indicators: Spasm Pain Intervention(s): Monitored during session;Repositioned    Home Living Family/patient expects to be discharged to:: Private residence Living Arrangements: Spouse/significant other;Children Available Help at Discharge: Family;Available 24 hours/day Type of Home: Mobile home Home Access: Stairs to enter Entrance Stairs-Rails: Right;Left;Can reach both Home Layout: One level Home Equipment: None      Prior Function Level of Independence: Independent      Comments: Pt injured at work.   PT Goals (current goals can now be found in  the care plan section) Acute Rehab PT Goals Patient Stated Goal: decrease pain Potential to Achieve Goals: Good Progress towards PT goals: Progressing toward goals    Frequency    Min 6X/week      PT Plan Discharge plan needs to be updated    Co-evaluation             End of Session Equipment Utilized During Treatment:  (no gait belt used due to rib fractures.  ) Activity Tolerance: Patient limited by pain (and muscle spasms in L flank) Patient left: in bed;with call bell/phone within reach     Time: 1610-9604 PT Time Calculation (min) (ACUTE ONLY): 26 min  Charges:  $Therapeutic  Activity: 23-37 mins                    G Codes:      Florestine Avers 26-May-2016, 6:23 PM  Joycelyn Rua, PTA pager [phone]

## 2016-05-10 NOTE — Evaluation (Signed)
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 The truck partially landed on his left chest while rolling. He was able to slide himself from under the truck in a matter of seconds. No loss of consciousness or head trauma. Xray revealed L rib fxs 2-8 and 10. No fx noted L knee.   Clinical Impression   PT admitted with L rib fx 2-8 and 10. Pt currently with functional limitiations due to the deficits listed below (see OT problem list). pTA was independent with all adls. Pt very limited this session by muscle spasms. WIFE VERY Concern with d/c home due to pain management.  Pt will benefit from skilled OT to increase their independence and safety with adls and balance to allow discharge HHOT. Pt unable to progress to EOB this session. Rn arriving to give medications.      Follow Up Recommendations  Home health OT    Equipment Recommendations  3 in 1 bedside comode;Other (comment) (RW)    Recommendations for Other Services       Precautions / Restrictions Precautions Precautions: Fall Precaution Comments: premedicate due to spasms      Mobility Bed Mobility Overal bed mobility: Needs Assistance Bed Mobility: Rolling Rolling: Max assist         General bed mobility comments: pt unable to tolerate bed mobility. pt provided pillow to help with rib fx. Pt unable to achieve EOB  Transfers                 General transfer comment: unable to achieve    Balance                                            ADL Overall ADL's : Needs assistance/impaired   Eating/Feeding Details (indicate cue type and reason): unable to eat lunch due to pain and inability to get oOb today per patient Grooming: Wash/dry face;Wash/dry hands;Oral care;Min guard;Bed level   Upper Body Bathing: Maximal assistance   Lower Body Bathing: Maximal assistance                         General ADL Comments: Wife present and agreeable to (A) for transfer to learn how to transfer patient. wife concerned with home level. pt with severe spasms and unable to reach EOB sitting this session. RN called and arriving with morphine. OT speaking with RN regarding pending d/c with severe pain and inability to manage pain on IV morphine at home. RN reports addressing with MD      Vision     Perception     Praxis      Pertinent Vitals/Pain Pain Assessment: 0-10 Pain Score: 10-Worst pain ever Pain Location: L flank with severe spasms Pain Descriptors / Indicators: Spasm Pain Intervention(s): Monitored during session;Limited activity within patient's tolerance;Repositioned;RN gave pain meds during session;Patient requesting pain meds-RN notified     Hand Dominance Right   Extremity/Trunk Assessment Upper Extremity Assessment Upper Extremity Assessment: Generalized weakness (difficulty with shoulder elevation at 90 due to pain)   Lower Extremity Assessment Lower Extremity Assessment: Defer to PT evaluation   Cervical / Trunk Assessment Cervical / Trunk Assessment: Normal   Communication Communication Communication: No difficulties   Cognition Arousal/Alertness:  Awake/alert Behavior During Therapy: WFL for tasks assessed/performed Overall Cognitive Status: Within Functional Limits for tasks assessed                     General Comments       Exercises       Shoulder Instructions      Home Living Family/patient expects to be discharged to:: Private residence Living Arrangements: Spouse/significant other;Children Available Help at Discharge: Family;Available 24 hours/day Type of Home: Mobile home Home Access: Stairs to enter Entrance Stairs-Number of Steps: 5 Entrance Stairs-Rails: Right;Left;Can reach both Home Layout: One level         Bathroom Toilet: Standard     Home  Equipment: None          Prior Functioning/Environment Level of Independence: Independent        Comments: Pt injured at work.        OT Problem List: Decreased strength;Decreased activity tolerance;Impaired balance (sitting and/or standing);Decreased safety awareness;Decreased knowledge of use of DME or AE;Decreased knowledge of precautions;Pain   OT Treatment/Interventions: Self-care/ADL training;Neuromuscular education;Therapeutic exercise;DME and/or AE instruction;Therapeutic activities;Patient/family education;Balance training    OT Goals(Current goals can be found in the care plan section) Acute Rehab OT Goals Patient Stated Goal: decrease pain OT Goal Formulation: With patient/family Time For Goal Achievement: 05/24/16 Potential to Achieve Goals: Good  OT Frequency: Min 3X/week   Barriers to D/C:            Co-evaluation              End of Session Nurse Communication: Mobility status;Precautions  Activity Tolerance: Patient limited by pain Patient left: in bed;with call bell/phone within reach;with bed alarm set;with family/visitor present;with SCD's reapplied   Time: 1425-1500 OT Time Calculation (min): 35 min Charges:  OT General Charges $OT Visit: 1 Procedure OT Evaluation $OT Eval Moderate Complexity: 1 Procedure OT Treatments $Self Care/Home Management : 8-22 mins G-Codes:    Boone MasterJones, Astha Probasco B 05/10/2016, 3:07 PM   Mateo FlowJones, Brynn   OTR/L Pager: [phone]: [phone] Office: [phone] .

## 2016-05-11 MED ORDER — NAPROXEN 500 MG PO TABS: 500.0000 mg | ORAL_TABLET | Freq: Two times a day (BID) | ORAL | 1 refills | Status: AC

## 2016-05-11 MED ORDER — TRAMADOL HCL 50 MG PO TABS: 100.0000 mg | ORAL_TABLET | Freq: Four times a day (QID) | ORAL | 0 refills | Status: AC

## 2016-05-11 MED ORDER — OXYCODONE-ACETAMINOPHEN 10-325 MG PO TABS: 1.0000 | ORAL_TABLET | ORAL | 0 refills | Status: AC | PRN

## 2016-05-11 MED ORDER — METHOCARBAMOL 500 MG PO TABS: 1000.0000 mg | ORAL_TABLET | Freq: Four times a day (QID) | ORAL | 0 refills | Status: AC | PRN

## 2016-05-11 NOTE — Discharge Summary (Signed)
Physician Discharge Summary  Patient ID: [name] MRN: [id number] DOB/AGE: 1966/02/05 50 y.o.  Admit date: 05/08/2016 Discharge date: 05/11/2016  Discharge Diagnoses Patient Active Problem List   Diagnosis Date Noted  . Blunt chest trauma 05/10/2016  . Multiple rib fractures involving four or more ribs 05/09/2016    Consultants None   Procedures None   HPI: Austin BeeKevin was working under a [employer] truck when the jack slipped. The truck partially landed on his left chest while rolling. He was able to slide himself from under the truck in a matter of seconds. There was no loss of consciousness or head trauma. His workup included CT scans of the chest as well as extremity x-rays which showed the above-mentioned injuries. He was admitted to the trauma service.    Hospital Course: The patient did not suffer any respiratory compromise from his rib fractures. He was mobilized with physical and occupational therapies and did so poorly they recommended inpatient rehabilitation. After adjustment and addition of his pain and adjunctive medications he improved dramatically and was independent with mobility the following day. He was discharged home in good condition.     Medication List    TAKE these medications   FISH OIL PO Take 1 capsule by mouth daily.   methocarbamol 500 MG tablet Commonly known as:  ROBAXIN Take 2 tablets (1,000 mg total) by mouth every 6 (six) hours as needed for muscle spasms.   naproxen 500 MG tablet Commonly known as:  NAPROSYN Take 1 tablet (500 mg total) by mouth 2 (two) times daily with a meal.   oxyCODONE-acetaminophen 10-325 MG tablet Commonly known as:  PERCOCET Take 1-2 tablets by mouth every 4 (four) hours as needed for pain.   traMADol 50 MG tablet Commonly known as:  ULTRAM Take 2 tablets (100 mg total) by mouth every 6 (six) hours.       Follow-up Information    [facility] Follow up on 06/07/2016.   Why:  1:30PM Contact  information: [address] [place] [id number] [phone]           Signed: Freeman CaldronMichael J. Briahna Pescador, PA-C Pager: [phone] General Trauma PA Pager: [phone] 05/11/2016, 2:42 PM

## 2016-05-11 NOTE — Progress Notes (Signed)
Occupational Therapy Treatment Patient Details Name: Austin SlatesKevin Horne MRN: [id number] DOB: 16-Mar-1966 Today's Date: 05/11/2016    History of present illness 50 yo male working under [employer] truck when jack slipped. The truck partially landed on his left chest while rolling. He was able to slide himself from under the truck in a matter of seconds. No loss of consciousness or head trauma. Xray revealed L rib fxs 2-8 and 10. No fx noted L knee.   OT comments  Patient doing much better today, pain controlled. Able to do ADLs/mobility at supervision level with occasional min A. Patient reports he is ready to go home.    Follow Up Recommendations  No OT follow up    Equipment Recommendations  None recommended by OT    Recommendations for Other Services      Precautions / Restrictions Precautions Precautions: Fall Precaution Comments: premedicate due to spasms Restrictions Weight Bearing Restrictions: No       Mobility Bed Mobility Overal bed mobility: Needs Assistance Bed Mobility: Supine to Sit     Supine to sit: Supervision     General bed mobility comments: CUes for strategies and increased time but no assist required.  Pt performed from flat bed without rails to simulate home environment.    Transfers Overall transfer level: Needs assistance Equipment used: None Transfers: Sit to/from Stand Sit to Stand: Modified independent (Device/Increase time)         General transfer comment: Increased time with multiple trials when pain/spasms arise.  Pt used R arm to push from right thigh. Slow movement/guarded movement but no physical assistance necessary.      Balance     Sitting balance-Leahy Scale: Normal       Standing balance-Leahy Scale: Good                     ADL Overall ADL's : Needs assistance/impaired     Grooming: Supervision/safety;Standing           Upper Body Dressing : Set up;Supervision/safety;Sitting   Lower Body Dressing:  Supervision/safety;Minimal assistance;Sit to/from stand   Toilet Transfer: Supervision/safety;Ambulation;Comfort height toilet   Toileting- Clothing Manipulation and Hygiene: Supervision/safety;Sit to/from stand       Functional mobility during ADLs: Supervision/safety General ADL Comments: Patient doing much better today. Fully dressed in regular clothes. Demo ability to reach all body parts. Ambulated to bathroom and practiced toilet transfer. Ambulation in halls, practiced stairs, ambulated back his room. Discussed using a pillow for bracing during coughing/mobility. Updated d/c recs to no follow-up.      Vision                     Perception     Praxis      Cognition   Behavior During Therapy: WFL for tasks assessed/performed Overall Cognitive Status: Within Functional Limits for tasks assessed                       Extremity/Trunk Assessment               Exercises     Shoulder Instructions       General Comments      Pertinent Vitals/ Pain       Pain Assessment: 0-10 Pain Score: 5  Pain Location: L flank Pain Descriptors / Indicators: Sore Pain Intervention(s): Monitored during session;Repositioned  Home Living  Prior Functioning/Environment              Frequency  Min 3X/week        Progress Toward Goals  OT Goals(current goals can now be found in the care plan section)  Progress towards OT goals: Progressing toward goals  Acute Rehab OT Goals Patient Stated Goal: decrease pain  Plan Discharge plan needs to be updated    Co-evaluation    PT/OT/SLP Co-Evaluation/Treatment: Yes Reason for Co-Treatment: For patient/therapist safety PT goals addressed during session: Mobility/safety with mobility OT goals addressed during session: ADL's and self-care      End of Session Equipment Utilized During Treatment: Gait belt   Activity Tolerance Patient tolerated  treatment well   Patient Left    Nurse Communication Mobility status        Time: 9604-5409 OT Time Calculation (min): 23 min  Charges: OT General Charges $OT Visit: 1 Procedure OT Treatments $Self Care/Home Management : 8-22 mins  Rafi Kenneth A 05/11/2016, 11:46 AM

## 2016-05-11 NOTE — Progress Notes (Signed)
Patient ID: Austin Horne, male   DOB: May 30, 1966, 50 y.o.   MRN: [id number]   LOS: 1 day   Subjective: Doing much better today   Objective: Vital signs in last 24 hours: Temp:  [98.3 F (36.8 C)-98.6 F (37 C)] 98.6 F (37 C) (10/12 0528) Pulse Rate:  [61-70] 70 (10/12 0528) Resp:  [16] 16 (10/12 0528) BP: (126-152)/(71-94) 138/71 (10/12 0528) SpO2:  [93 %-96 %] 93 % (10/12 0528) Last BM Date: 05/08/16   IS: 1750ml (+12850ml)   Physical Exam General appearance: alert and no distress Resp: clear to auscultation bilaterally Cardio: regular rate and rhythm GI: normal findings: bowel sounds normal and soft, non-tender   Assessment/Plan: Blunt thoracic trauma Multiple left rib fxs -- Pulmonary toilet FEN -- No issues VTE -- SCD's, Lovenox Dispo -- PT recommended CIR yesterday but neither pt or I think he needs it now. Will allow for another assessment today but anticipate home this afternoon.    Freeman CaldronMichael J. Oceana Walthall, PA-C Pager: [phone] General Trauma PA Pager: [phone]  05/11/2016

## 2016-05-11 NOTE — Progress Notes (Signed)
D/C papers gone over with pt. Prescriptions given to pt. Iv taken out. No questions/complaints. Pt. D/c'd via w/c.

## 2016-05-11 NOTE — Progress Notes (Signed)
Physical Therapy Treatment Patient Details Name: Austin Horne MRN: [id number] DOB: Jan 20, 1966 Today's Date: 05/11/2016    History of Present Illness 50 yo male working under [employer] truck when jack slipped. The truck partially landed on his left chest while rolling. He was able to slide himself from under the truck in a matter of seconds. No loss of consciousness or head trauma. Xray revealed L rib fxs 2-8 and 10. No fx noted L knee.    PT Comments    Pt with significant improvement in functional mobility and pain management.  Pt performed multiple transfers, increased gait distance and stair training.  Based on patient's recent improvement he will no longer require rehab or PT f/u.  Pt is functioning safely and has complete understanding of splinting injury when coughing or straining.  Will inform supervising PT of need for change in recommendations.     Follow Up Recommendations  No PT follow up     Equipment Recommendations  None recommended by PT    Recommendations for Other Services Rehab consult     Precautions / Restrictions Precautions Precautions: Fall Precaution Comments: premedicate due to spasms Restrictions Weight Bearing Restrictions: No    Mobility  Bed Mobility Overal bed mobility: Needs Assistance       Supine to sit: Supervision     General bed mobility comments: CUes for strategies and increased time but no assist required.  Pt performed from falt bed without rails to simulate home environment.    Transfers Overall transfer level: Needs assistance   Transfers: Sit to/from Stand Sit to Stand: Modified independent (Device/Increase time)         General transfer comment: Increased time with multiple trials when pain/spasms arise.  PT used R arm to push from right thigh. Slow movement/guarded movement but no physical assistance necessary.    Ambulation/Gait Ambulation/Gait assistance: Supervision Ambulation Distance (Feet): 312 Feet Assistive  device: None Gait Pattern/deviations: Step-through pattern;Decreased stride length Gait velocity: decreased Gait velocity interpretation: Below normal speed for age/gender General Gait Details: Spasm noted x 1 with slow buckle in which patient grabbed R railing.  Pt educated if spasms persist at home a cane might be good for community use to stabilize until pain subsides.  NO LOB noted and good reciprocal armswing.     Stairs Stairs: Yes   Stair Management: One rail Right;Forwards;Step to pattern;Alternating pattern Number of Stairs: 5 General stair comments: Step to descending and reciprocal pattern ascending.  Pt used R right as LUE uses causes spasm.    Wheelchair Mobility    Modified Rankin (Stroke Patients Only)       Balance     Sitting balance-Leahy Scale: Normal       Standing balance-Leahy Scale: Good                      Cognition Arousal/Alertness: Awake/alert Behavior During Therapy: WFL for tasks assessed/performed Overall Cognitive Status: Within Functional Limits for tasks assessed                      Exercises      General Comments        Pertinent Vitals/Pain Pain Assessment: 0-10 Pain Score: 5  Pain Location: L flank Pain Descriptors / Indicators: Spasm (intermittent) Pain Intervention(s): Monitored during session;Repositioned    Home Living                      Prior Function  PT Goals (current goals can now be found in the care plan section) Acute Rehab PT Goals Patient Stated Goal: decrease pain Potential to Achieve Goals: Good Progress towards PT goals: Progressing toward goals    Frequency           PT Plan Discharge plan needs to be updated    Co-evaluation PT/OT/SLP Co-Evaluation/Treatment: Yes Reason for Co-Treatment: For patient/therapist safety (based on previous tx.  )         End of Session Equipment Utilized During Treatment: Gait belt Activity Tolerance: Patient  tolerated treatment well Patient left:  (standing in room unassisted.  )     Time: 8295-62131055-1118 PT Time Calculation (min) (ACUTE ONLY): 23 min  Charges:  $Gait Training: 8-22 mins                    G Codes:      Florestine Aversimee J Icey Tello 05/11/2016, 11:26 AM  Joycelyn RuaAimee Kirklin Mcduffee, PTA pager [phone]

## 2016-05-11 NOTE — Progress Notes (Signed)
Completed and signed FMLA paperwork faxed to pt's wife's employer, per her request.  [employer] returned to wife.    Quintella BatonJulie W. Seif Teichert, RN, BSN  Trauma/Neuro ICU Case Manager [phone]

## 2016-05-11 NOTE — Discharge Instructions (Signed)
No driving while taking oxycodone. ° °Increase activity as pain allows. °

## 2016-08-01 ENCOUNTER — Ambulatory Visit: Payer: Worker's Compensation | Admitting: Physical Therapy

## 2016-08-14 ENCOUNTER — Telehealth (HOSPITAL_COMMUNITY): Payer: Self-pay

## 2016-08-14 NOTE — Telephone Encounter (Signed)
Made appt for 2/15 after he's done with his PT program to see about rtw status

## 2016-12-22 ENCOUNTER — Other Ambulatory Visit: Payer: Self-pay | Admitting: General Surgery

## 2016-12-22 ENCOUNTER — Ambulatory Visit
Admission: RE | Admit: 2016-12-22 | Discharge: 2016-12-22 | Disposition: A | Discharge status: Home / Self Care | Payer: Worker's Compensation | Source: Ambulatory Visit | Attending: General Surgery | Admitting: General Surgery

## 2016-12-22 DIAGNOSIS — S2249XA Multiple fractures of ribs, unspecified side, initial encounter for closed fracture: Secondary | ICD-10-CM

## 2017-11-10 IMAGING — CT CT CHEST W/ CM
2 of 3 series · 15 of 36 positions shown, 18 images · IV contrast (iopamidol)
Comparison: Radiograph earlier this day.

CLINICAL DATA: [employer] truck fell on left chest.  Left chest pain.

EXAM:
CT CHEST WITH CONTRAST
TECHNIQUE: Multidetector CT imaging of the chest was performed during
intravenous contrast administration.
CONTRAST:  80mL LF40II-BDD IOPAMIDOL (LF40II-BDD) INJECTION 61%

[Series 2: chest with 2mm st · axial · 0.71mm/px · z∈[-322,-54]mm · 12 of 158 slices shown, 15 images]
[im 12/158  mediastinal]
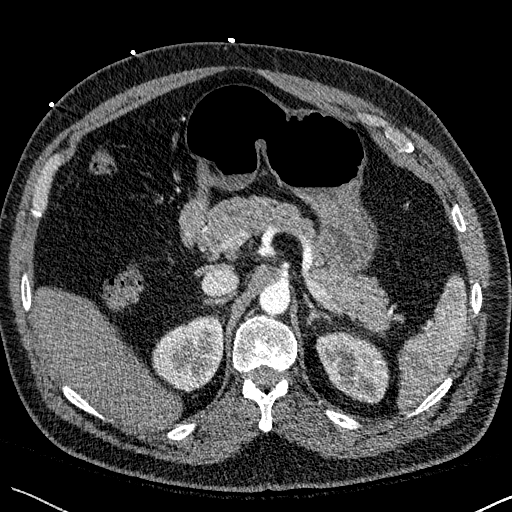
[im 12/158  lung]
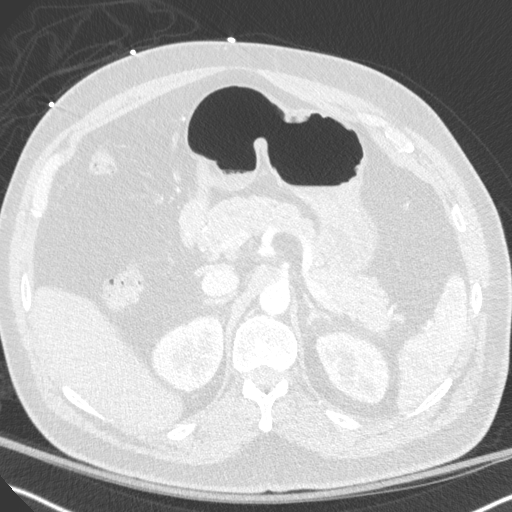
[im 24/158  lung]
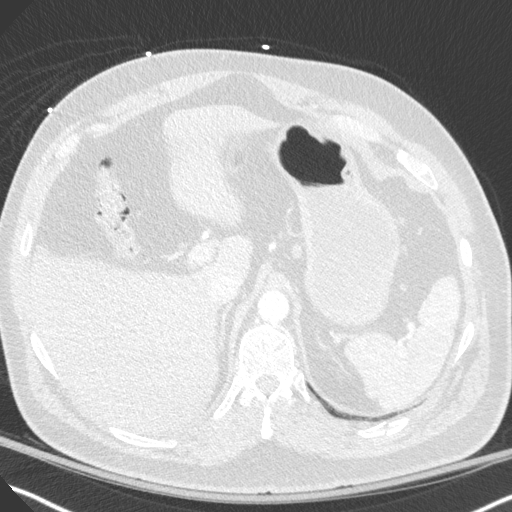
[im 35/158  lung]
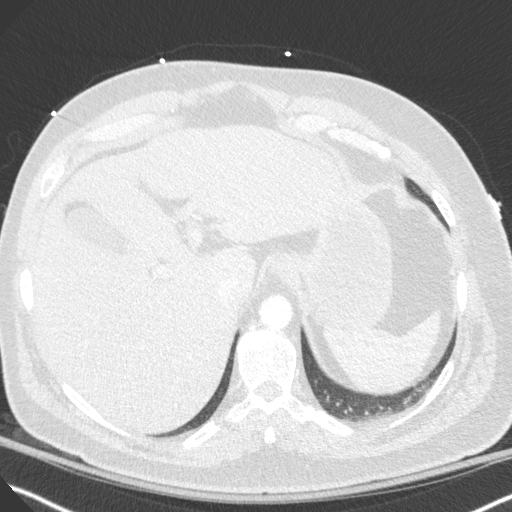
[im 47/158  lung]
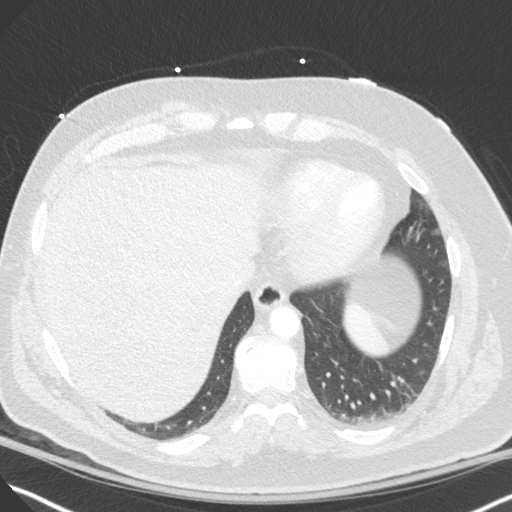
[im 59/158  mediastinal]
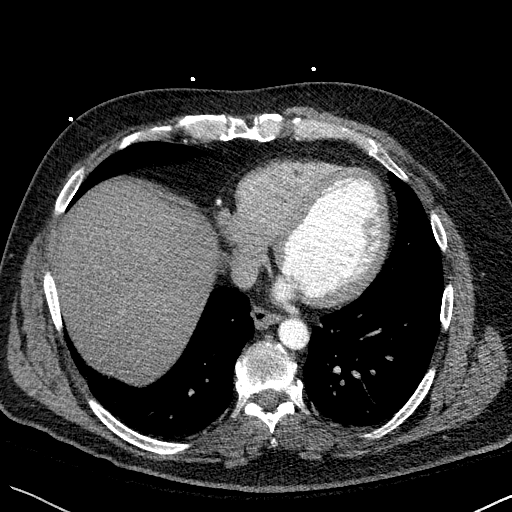
[im 59/158  lung]
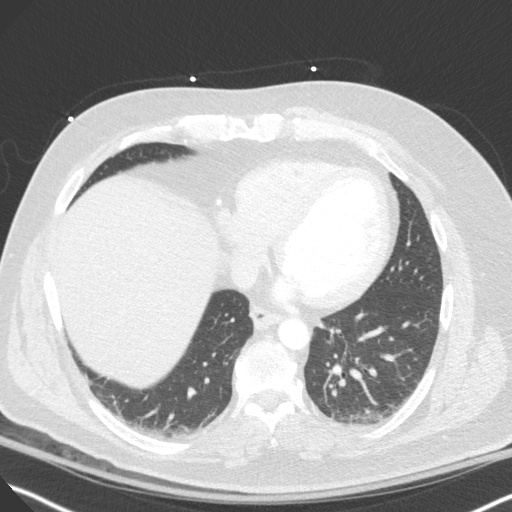
[im 70/158  lung]
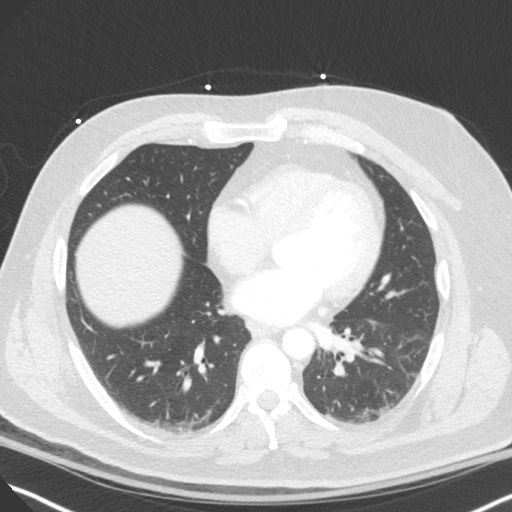
[im 88/158  lung]
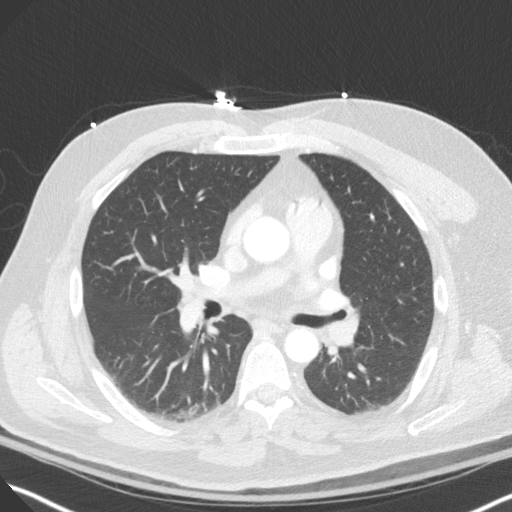
[im 99/158  lung]
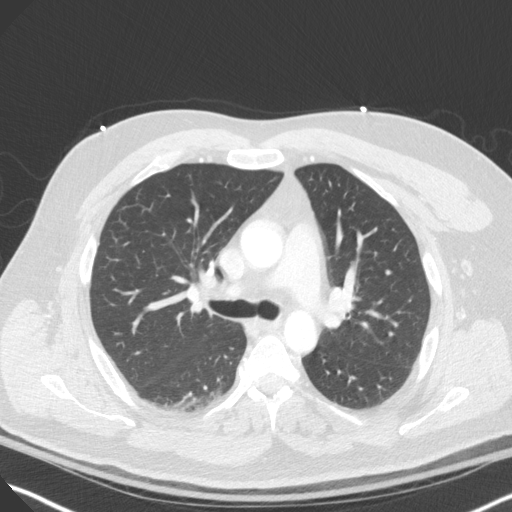
[im 111/158  mediastinal]
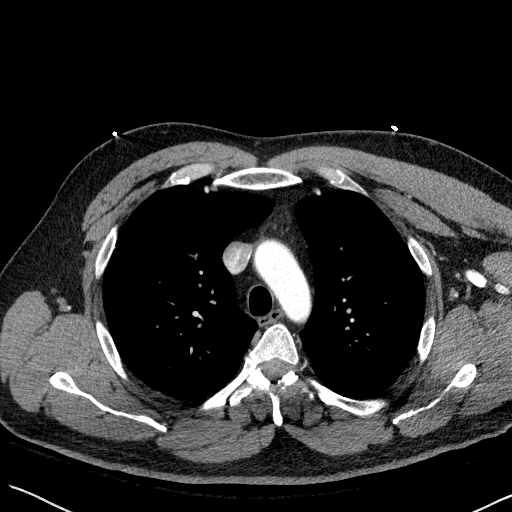
[im 111/158  lung]
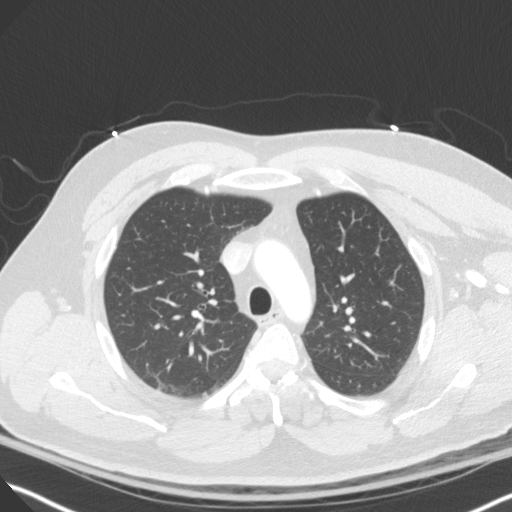
[im 123/158  lung]
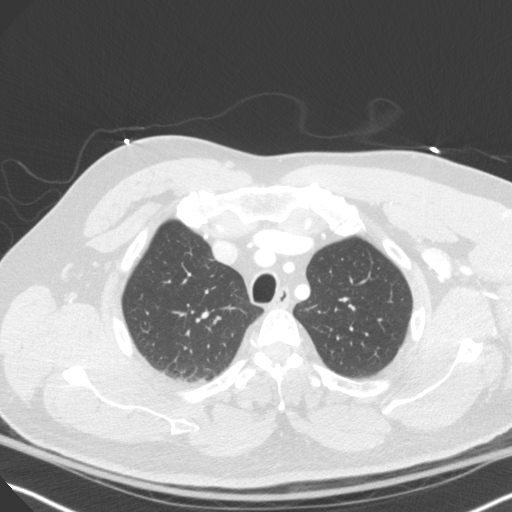
[im 134/158  lung]
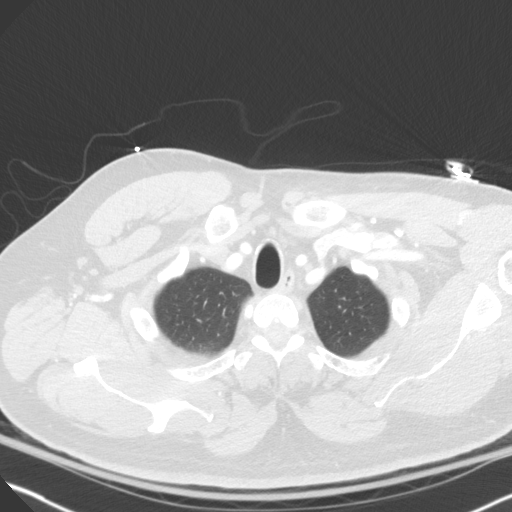
[im 146/158  lung]
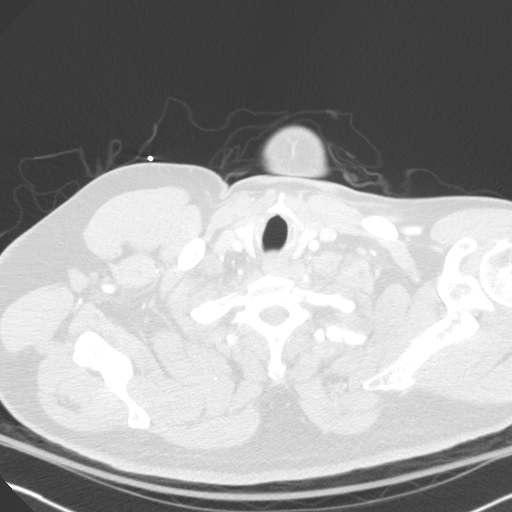

[Series 5: chest with 3mm st cor · coronal · 0.64mm/px · 3 of 86 slices shown]
[im 18/86  lung]
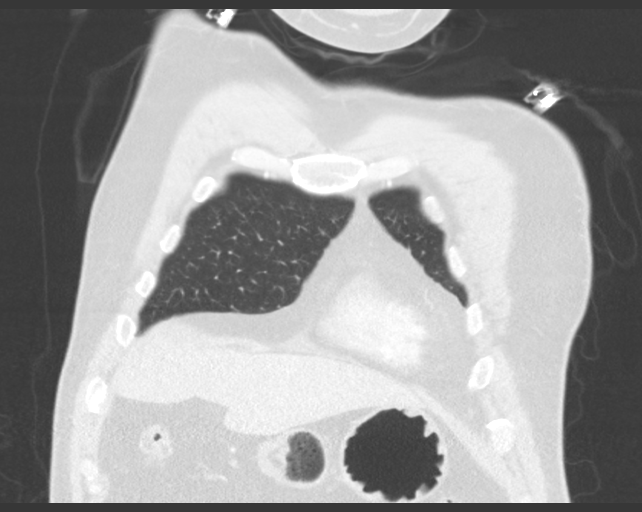
[im 35/86  lung]
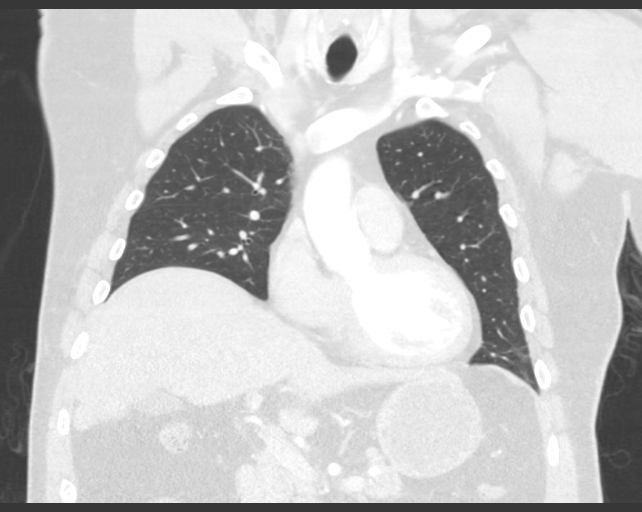
[im 52/86  lung]
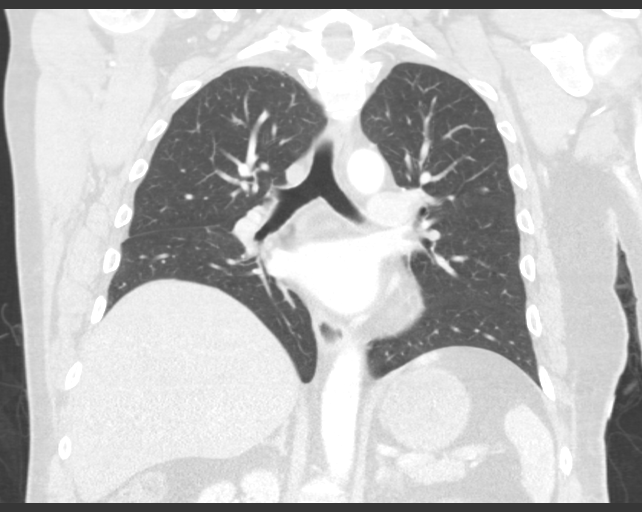

[15 of 36 positions shown; findings below may reference images not displayed]

FINDINGS: Cardiovascular: No significant vascular findings. Normal heart size.
No pericardial effusion.

Mediastinum/Nodes: No enlarged mediastinal, hilar, or axillary lymph
nodes. Thyroid gland, trachea, and esophagus demonstrate no
significant findings.

Lungs/Pleura: No pneumo or hemothorax. There is left pleural
thickening adjacent to multiple rib fractures. No pulmonary
contusion. Minimal atelectasis adjacent pleural thickening. Minimal
dependent atelectasis also seen in the right lung. No pulmonary
contusion.

Upper Abdomen: No acute abnormality. Majority of the spleen is
visualized and normal, no perisplenic fluid.

Musculoskeletal: Note, there are 11 pairs of ribs in the included
field of view. Multiple left-sided rib fractures. Fractures of the
second through eighth ribs posteriorly are nondisplaced. Fracture of
posterior left tenth rib is minimally displaced. No sternal
fracture. No fracture or subluxation of the thoracic spine. Well
corticated density posterior to spinous process of T1 appears
chronic.
IMPRESSION: Multiple posterior left rib fractures, nondisplaced involving ribs 2
through 8 and minimally displaced involving rib 10. Minimal adjacent
pleural thickening but no hemothorax or pneumothorax.
